# Patient Record
Sex: Female | Born: 1995 | Race: Black or African American | Hispanic: No | Marital: Single | Smoking: Never smoker
Health system: Southern US, Community
[De-identification: ages and names within clinical notes are randomized; demographics above are authoritative.]

## PROBLEM LIST (undated history)

## (undated) DIAGNOSIS — E282 Polycystic ovarian syndrome: Secondary | ICD-10-CM

---
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
## 2015-11-06 IMAGING — CR DG CHEST 2V
2 series · 2 of 2 positions shown · non-contrast
Comparison: None.

CLINICAL DATA: 18-year-old female with cough, fever and cold
symptoms x1 week

EXAM:
CHEST  2 VIEW

[w chest pa]
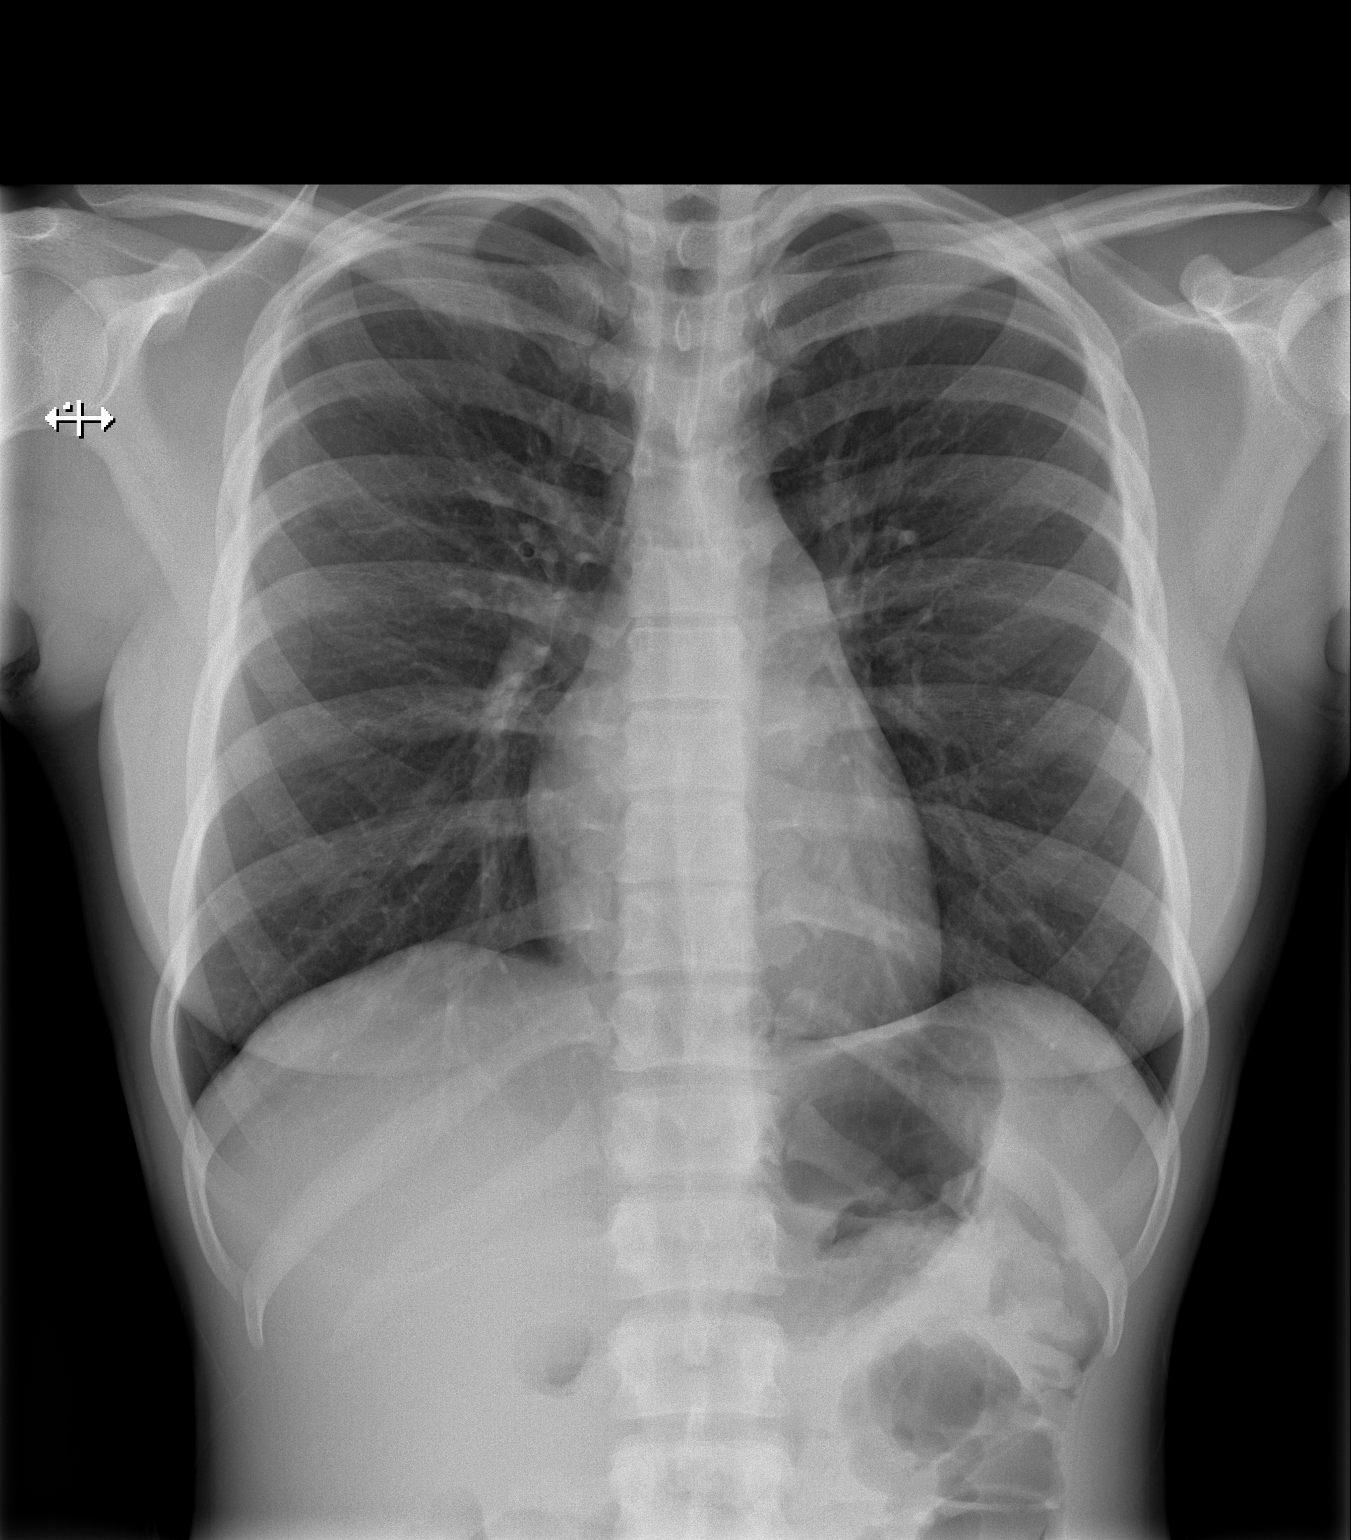

[w chest lat]
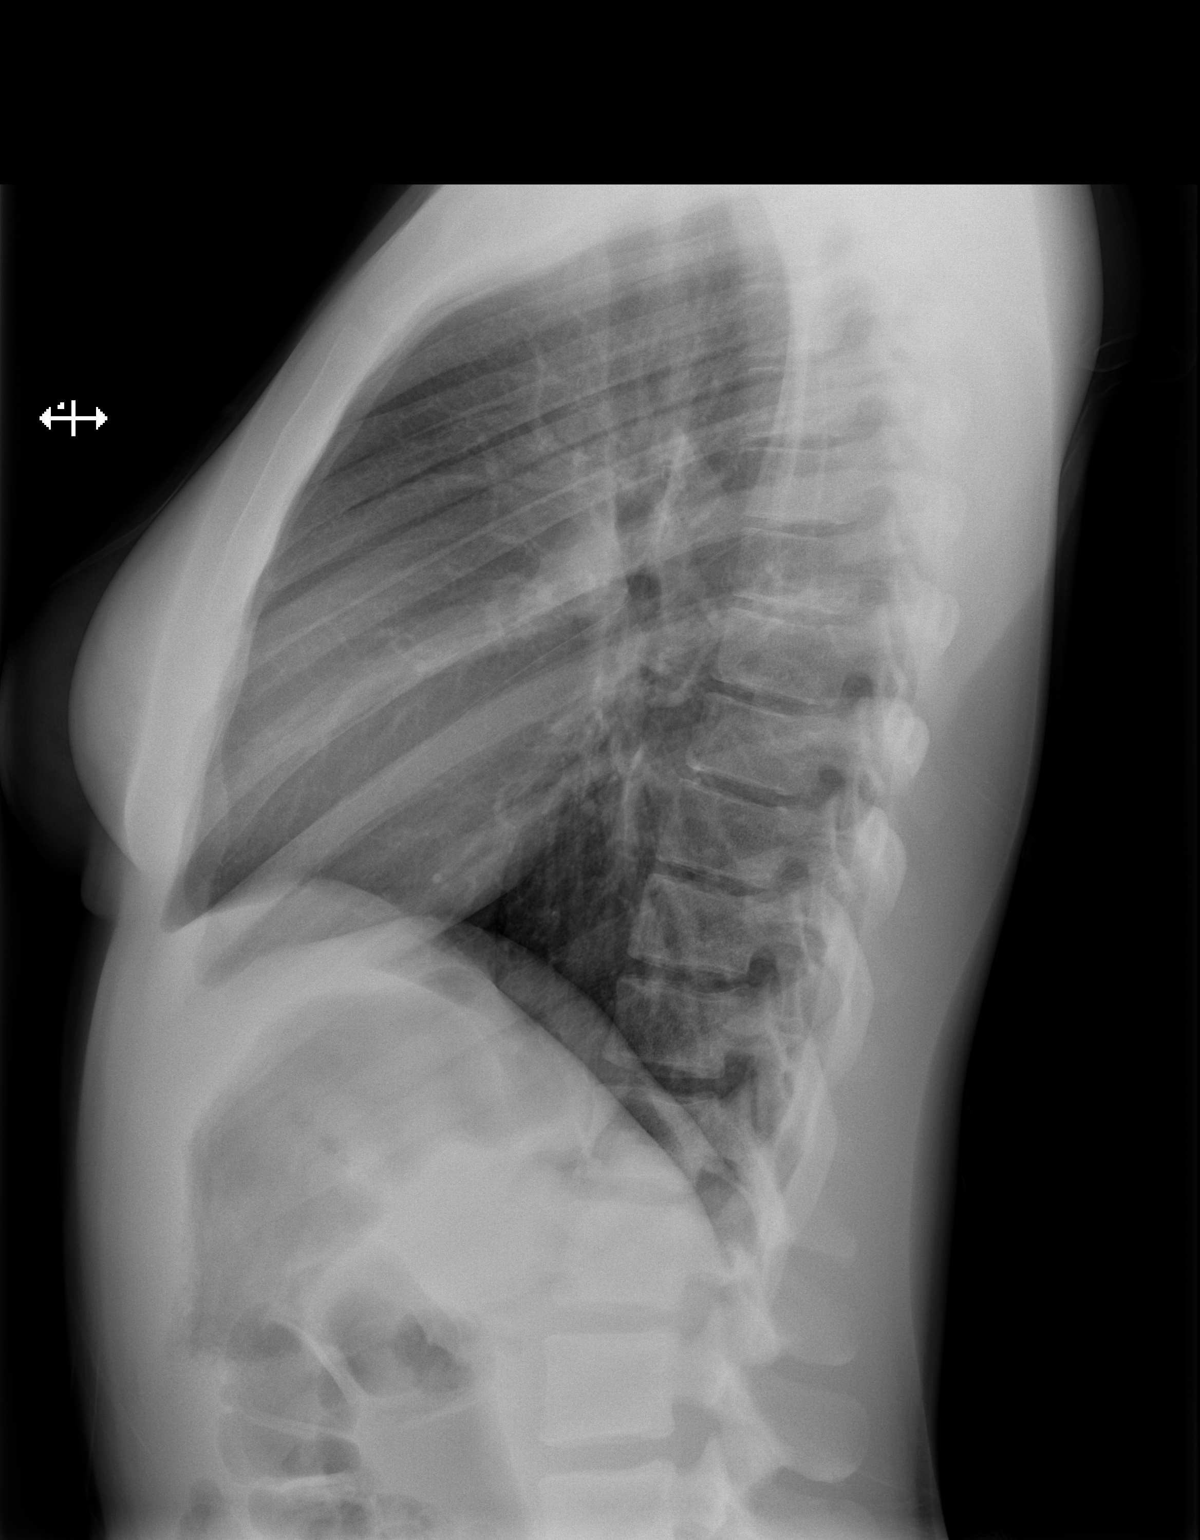

[2 of 2 positions shown; findings below may reference images not displayed]

FINDINGS: The heart size and mediastinal contours are within normal limits.
Both lungs are clear. The visualized skeletal structures are
unremarkable.
IMPRESSION: No active cardiopulmonary disease.

## 2017-04-06 ENCOUNTER — Ambulatory Visit (HOSPITAL_COMMUNITY)
Admission: EM | Admit: 2017-04-06 | Discharge: 2017-04-06 | Disposition: A | Discharge status: Other Acute Inpatient Hospital | Payer: Self-pay

## 2024-08-02 ENCOUNTER — Other Ambulatory Visit: Payer: Self-pay

## 2024-08-02 ENCOUNTER — Ambulatory Visit

## 2024-08-02 ENCOUNTER — Ambulatory Visit
Admission: EM | Admit: 2024-08-02 | Discharge: 2024-08-02 | Disposition: A | Discharge status: Home / Self Care | Attending: Family Medicine | Admitting: Family Medicine

## 2024-08-02 DIAGNOSIS — M940 Chondrocostal junction syndrome [Tietze]: Secondary | ICD-10-CM | POA: Diagnosis not present

## 2024-08-02 DIAGNOSIS — R0789 Other chest pain: Secondary | ICD-10-CM | POA: Diagnosis not present

## 2024-08-02 HISTORY — DX: Polycystic ovarian syndrome: E28.2

## 2024-08-02 NOTE — Discharge Instructions (Addendum)
Apply ice pack for 20 to 30 minutes, 3 to 4 times daily  Continue until pain decreases.  May take Ibuprofen 200mg, 4 tabs every 8 hours with food.  

## 2024-08-02 NOTE — ED Triage Notes (Signed)
 Pt c/o non radiating left sided chest painx2wks. Pt denies N/V, SOB

## 2024-08-02 NOTE — ED Provider Notes (Signed)
 " [facility]    CSN: [id number] Arrival date & time: 08/02/24  1223      History   Chief Complaint No chief complaint on file.   HPI Katherine Cobb is a 29 y.o. female.   Patient reports that she has had persistent non-radiating pain in her central and left upper chest for two weeks.  The pain is worse with movement and deep inspiration.  She denies cough, shortness of breath, nausea/vomiting.  She denies injury but admits that she works out at a gym regularly, and states that she has been doing pectoral fly exercises.  The history is provided by the patient.    Past Medical History:  Diagnosis Date   PCOS (polycystic ovarian syndrome)     There are no active problems to display for this patient.   History reviewed. No pertinent surgical history.  OB History   No obstetric history on file.      Home Medications    Prior to Admission medications  Medication Sig Start Date End Date Taking? Authorizing Provider  cholecalciferol (VITAMIN D3) 10 MCG/ML LIQD oral liquid Take 400 Units by mouth daily.   Yes [provider]  Collagen-Vitamin C-Biotin (COLLAGEN PO) Take by mouth.   Yes [provider]  magnesium oxide (MAG-OX) 400 (240 Mg) MG tablet Take 400 mg by mouth daily.   Yes [provider]  azithromycin  (ZITHROMAX  Z-PAK) 250 MG tablet Take 1 tablet (250 mg total) by mouth daily. 500mg  PO day 1, then 250mg  PO days 205 05/27/14   Szekalski, Kaitlyn, PA-C  HYDROcodone -homatropine (HYCODAN) 5-1.5 MG/5ML syrup Take 5 mLs by mouth every 6 (six) hours as needed. 05/27/14   Szekalski, Kaitlyn, PA-C    Family History History reviewed. No pertinent family history.  Social History Social History[1]   Allergies   Patient has no known allergies.   Review of Systems Review of Systems  Constitutional:  Negative for activity change, appetite change, chills, diaphoresis, fatigue and fever.  Respiratory:  Negative for cough, chest  tightness, shortness of breath, wheezing and stridor.   Cardiovascular:  Positive for chest pain. Negative for palpitations and leg swelling.  Skin:  Negative for rash.  All other systems reviewed and are negative.    Physical Exam Triage Vital Signs ED Triage Vitals  Encounter Vitals Group     BP 08/02/24 1242 122/79     Girls Systolic BP Percentile --      Girls Diastolic BP Percentile --      Boys Systolic BP Percentile --      Boys Diastolic BP Percentile --      Pulse Rate 08/02/24 1242 64     Resp 08/02/24 1242 16     Temp 08/02/24 1242 98.2 F (36.8 C)     Temp src --      SpO2 08/02/24 1242 99 %     Weight --      Height --      Head Circumference --      Peak Flow --      Pain Score 08/02/24 1236 6     Pain Loc --      Pain Education --      Exclude from Growth Chart --    No data found.  Updated Vital Signs BP 122/79   Pulse 64   Temp 98.2 F (36.8 C)   Resp 16   LMP 07/27/2024   SpO2 99%   Visual Acuity Right Eye Distance:  Left Eye Distance:   Bilateral Distance:    Right Eye Near:   Left Eye Near:    Bilateral Near:     Physical Exam Vitals and nursing note reviewed.  Constitutional:      General: She is not in acute distress.    Appearance: She is not ill-appearing.  HENT:     Head: Normocephalic.     Mouth/Throat:     Mouth: Mucous membranes are moist.  Eyes:     Conjunctiva/sclera: Conjunctivae normal.     Pupils: Pupils are equal, round, and reactive to light.  Cardiovascular:     Rate and Rhythm: Normal rate and regular rhythm.     Heart sounds: Normal heart sounds.  Pulmonary:     Breath sounds: Normal breath sounds.  Chest:       Comments: Chest:  Distinct tenderness to palpation over the upper mid-sternum, more pronounced on the left edge of sternum.  Palpation there recreates her pain.  Abdominal:     Palpations: Abdomen is soft.     Tenderness: There is no abdominal tenderness.  Musculoskeletal:     Cervical back:  Neck supple.  Lymphadenopathy:     Cervical: No cervical adenopathy.  Skin:    General: Skin is warm and dry.     Findings: No rash.  Neurological:     Mental Status: She is alert.      UC Treatments / Results  Labs (all labs ordered are listed, but only abnormal results are displayed) Labs Reviewed - No data to display  EKG Rate:  56 BPM PR:  160 msec QT:   424 msec QTcH:  409 msec QRSD:  78 msec QRS axis:  83 degrees Interpretation:  Sinus bradycardia with sinus arrhythmia, otherwise normal.  Radiology DG Chest 2 View Result Date: 08/02/2024 CLINICAL DATA:  Two-week history of breast pain EXAM: CHEST - 2 VIEW COMPARISON:  Chest radiograph dated 05/27/2014 FINDINGS: Normal lung volumes. No focal consolidations. No pleural effusion or pneumothorax. The heart size and mediastinal contours are within normal limits. No acute osseous abnormality. IMPRESSION: No active cardiopulmonary disease. Electronically Signed   By: Limin  Xu M.D.   On: 08/02/2024 14:23    Procedures Procedures (including critical care time)  Medications Ordered in UC Medications - No data to display  Initial Impression / Assessment and Plan / UC Course  I have reviewed the triage vital signs and the nursing notes.  Pertinent labs & imaging results that were available during my care of the patient were reviewed by me and considered in my medical decision making (see chart for details).    Negative chest x-ray and EKG reasssuring. Followup with Sports Medicine if not improved about 2 weeks.  Final Clinical Impressions(s) / UC Diagnoses   Final diagnoses:  Sternum pain  Costochondritis     Discharge Instructions      Apply ice pack for 20 to 30 minutes, 3 to 4 times daily  Continue until pain decreases.  May take Ibuprofen 200mg , 4 tabs every 8 hours with food.      ED Prescriptions   None         [1]  Social History Tobacco Use   Smoking status: Never   Smokeless tobacco:  Never  Vaping Use   Vaping status: Never Used  Substance Use Topics   Alcohol use: Yes    Comment: occ   Drug use: Not Currently    Types: Marijuana     Katherine Garnette LABOR,  MD 08/03/24 1953  "

## 2024-09-13 ENCOUNTER — Ambulatory Visit: Admitting: Family Medicine

## 2024-09-19 ENCOUNTER — Encounter: Admitting: Obstetrics and Gynecology
